# Patient Record
Sex: Male | Born: 1970 | Race: White | Hispanic: No | Marital: Single | Smoking: Current some day smoker
Health system: Southern US, Community
[De-identification: ages and names within clinical notes are randomized; demographics above are authoritative.]

## PROBLEM LIST (undated history)

## (undated) DIAGNOSIS — M4800 Spinal stenosis, site unspecified: Secondary | ICD-10-CM

## (undated) HISTORY — PX: TONSILLECTOMY: SUR1361

---

## 2004-11-06 ENCOUNTER — Other Ambulatory Visit: Payer: Self-pay

## 2004-11-06 ENCOUNTER — Emergency Department: Payer: Self-pay | Admitting: Emergency Medicine

## 2013-03-10 ENCOUNTER — Emergency Department: Payer: Self-pay | Admitting: Emergency Medicine

## 2013-03-10 LAB — BASIC METABOLIC PANEL
Anion Gap: 4 — ABNORMAL LOW (ref 7–16)
BUN: 7 mg/dL (ref 7–18)
Calcium, Total: 8.8 mg/dL (ref 8.5–10.1)
Co2: 28 mmol/L (ref 21–32)
Creatinine: 1.02 mg/dL (ref 0.60–1.30)
Potassium: 3.8 mmol/L (ref 3.5–5.1)
Sodium: 136 mmol/L (ref 136–145)

## 2013-03-10 LAB — CBC
MCH: 31.8 pg (ref 26.0–34.0)
Platelet: 358 10*3/uL (ref 150–440)
RBC: 4.89 10*6/uL (ref 4.40–5.90)
WBC: 9.9 10*3/uL (ref 3.8–10.6)

## 2013-03-10 LAB — TROPONIN I: Troponin-I: <0.02 ng/mL

## 2014-02-11 ENCOUNTER — Emergency Department: Payer: Self-pay | Admitting: Emergency Medicine

## 2014-12-10 ENCOUNTER — Emergency Department: Payer: Managed Care, Other (non HMO)

## 2014-12-10 ENCOUNTER — Encounter: Payer: Self-pay | Admitting: Emergency Medicine

## 2014-12-10 ENCOUNTER — Emergency Department
Admission: EM | Admit: 2014-12-10 | Discharge: 2014-12-10 | Disposition: A | Discharge status: Home / Self Care | Payer: Managed Care, Other (non HMO) | Attending: Emergency Medicine | Admitting: Emergency Medicine

## 2014-12-10 DIAGNOSIS — S299XXA Unspecified injury of thorax, initial encounter: Secondary | ICD-10-CM | POA: Diagnosis present

## 2014-12-10 DIAGNOSIS — Z720 Tobacco use: Secondary | ICD-10-CM | POA: Insufficient documentation

## 2014-12-10 DIAGNOSIS — Y9289 Other specified places as the place of occurrence of the external cause: Secondary | ICD-10-CM | POA: Insufficient documentation

## 2014-12-10 DIAGNOSIS — W271XXA Contact with garden tool, initial encounter: Secondary | ICD-10-CM | POA: Diagnosis not present

## 2014-12-10 DIAGNOSIS — S4992XA Unspecified injury of left shoulder and upper arm, initial encounter: Secondary | ICD-10-CM | POA: Diagnosis not present

## 2014-12-10 DIAGNOSIS — Y998 Other external cause status: Secondary | ICD-10-CM | POA: Diagnosis not present

## 2014-12-10 DIAGNOSIS — S4991XA Unspecified injury of right shoulder and upper arm, initial encounter: Secondary | ICD-10-CM | POA: Diagnosis not present

## 2014-12-10 DIAGNOSIS — Y9389 Activity, other specified: Secondary | ICD-10-CM | POA: Diagnosis not present

## 2014-12-10 DIAGNOSIS — S161XXA Strain of muscle, fascia and tendon at neck level, initial encounter: Secondary | ICD-10-CM | POA: Insufficient documentation

## 2014-12-10 MED ORDER — NAPROXEN 500 MG PO TABS: 500.0000 mg | ORAL_TABLET | Freq: Two times a day (BID) | ORAL | Status: AC

## 2014-12-10 MED ORDER — CYCLOBENZAPRINE HCL 10 MG PO TABS: 10.0000 mg | ORAL_TABLET | Freq: Three times a day (TID) | ORAL | Status: AC | PRN

## 2014-12-10 MED ORDER — CYCLOBENZAPRINE HCL 10 MG PO TABS
10.0000 mg | ORAL_TABLET | Freq: Once | ORAL | Status: AC
  Administered 2014-12-10: 10 mg via ORAL
  Filled 2014-12-10: qty 1

## 2014-12-10 MED ORDER — NAPROXEN 500 MG PO TABS
500.0000 mg | ORAL_TABLET | Freq: Once | ORAL | Status: AC
  Administered 2014-12-10: 500 mg via ORAL
  Filled 2014-12-10: qty 1

## 2014-12-10 NOTE — ED Provider Notes (Signed)
[facility] Emergency Department Provider Note  ____________________________________________  Time seen: Approximately 1:17 PM  I have reviewed the triage vital signs and the nursing notes.   HISTORY  Chief Complaint Back Pain   HPI GREGARY BLACKARD is a 44 y.o. male resents to the emergency department for evaluation of back pain. States yesterday while mowing the lawn he slid when he was trying to pull the mower up a hill. He states the mower came over top of him and hit him in the back. He woke up this morning with pain in the upper back and shoulders. History reviewed. No pertinent past medical history.  There are no active problems to display for this patient.   Past Surgical History  Procedure Laterality Date  . Tonsillectomy      Current Outpatient Rx  Name  Route  Sig  Dispense  Refill  . cyclobenzaprine (FLEXERIL) 10 MG tablet   Oral   Take 1 tablet (10 mg total) by mouth 3 (three) times daily as needed for muscle spasms.   30 tablet   0   . naproxen (NAPROSYN) 500 MG tablet   Oral   Take 1 tablet (500 mg total) by mouth 2 (two) times daily with a meal.   60 tablet   2     Allergies Review of patient's allergies indicates no known allergies.  No family history on file.  Social History Social History  Substance Use Topics  . Smoking status: Current Some Day Smoker    Types: Cigarettes  . Smokeless tobacco: None  . Alcohol Use: No    Review of Systems Constitutional: No recent illness. Eyes: No visual changes. ENT: No sore throat. Cardiovascular: Denies chest pain or palpitations. Respiratory: Denies shortness of breath. Gastrointestinal: No abdominal pain.  Genitourinary: Negative for dysuria. Musculoskeletal: Pain in upper back and bilateral shoulders Skin: Negative for rash. Neurological: Negative for headaches, focal weakness or numbness. 10-point ROS otherwise  negative.  ____________________________________________   PHYSICAL EXAM:  VITAL SIGNS: ED Triage Vitals  Enc Vitals Group     BP 12/10/14 1301 144/96 mmHg     Pulse Rate 12/10/14 1301 67     Resp 12/10/14 1301 18     Temp 12/10/14 1301 98.3 F (36.8 C)     Temp Source 12/10/14 1301 Oral     SpO2 12/10/14 1301 99 %     Weight 12/10/14 1259 185 lb (83.915 kg)     Height 12/10/14 1259 5\' 9"  (1.753 m)     Head Cir --      Peak Flow --      Pain Score 12/10/14 1300 7     Pain Loc --      Pain Edu? --      Excl. in GC? --     Constitutional: Alert and oriented. Well appearing and in no acute distress. Eyes: Conjunctivae are normal. EOMI. Head: Atraumatic. Nose: No congestion/rhinnorhea. Neck: No stridor.  Respiratory: Normal respiratory effort.   Musculoskeletal: Paraspinal tenderness of C-spine bilaterally that radiates down into the scapular region. No midline tenderness-nexus criteria negative. Neurologic:  Normal speech and language. No gross focal neurologic deficits are appreciated. Speech is normal. No gait instability. Skin:  Skin is warm, dry and intact. Atraumatic. Psychiatric: Mood and affect are normal. Speech and behavior are normal.  ____________________________________________   LABS (all labs ordered are listed, but only abnormal results are displayed)  Labs Reviewed - No data to display ____________________________________________  RADIOLOGY  C-spine  film negative for acute bony abnormality.  I, Kem Boroughs, personally viewed and evaluated these images (plain radiographs) as part of my medical decision making.  ____________________________________________   PROCEDURES  Procedure(s) performed: None   ____________________________________________   INITIAL IMPRESSION / ASSESSMENT AND PLAN / ED COURSE  Pertinent labs & imaging results that were available during my care of the patient were reviewed by me and considered in my medical decision  making (see chart for details).  Patient was advised to follow-up with his primary care provider or return to emergency department for symptoms that are not improving over the week. He is advised to return to emergency sooner for symptoms that change or worsen. ____________________________________________   FINAL CLINICAL IMPRESSION(S) / ED DIAGNOSES  Final diagnoses:  Cervical strain, acute, initial encounter       Chinita Pester, FNP 12/10/14 1613  Governor Rooks, MD 12/12/14 [phone]

## 2014-12-10 NOTE — ED Notes (Signed)
Pt states he was mowing the lawn, slid while trying to mow lawn up hill, woke up this am with pain in upper back and shoulders.

## 2014-12-10 NOTE — Discharge Instructions (Signed)

## 2015-11-30 ENCOUNTER — Emergency Department
Admission: EM | Admit: 2015-11-30 | Discharge: 2015-11-30 | Disposition: A | Discharge status: Home / Self Care | Payer: Managed Care, Other (non HMO) | Attending: Emergency Medicine | Admitting: Emergency Medicine

## 2015-11-30 ENCOUNTER — Emergency Department: Payer: Managed Care, Other (non HMO)

## 2015-11-30 ENCOUNTER — Encounter: Payer: Self-pay | Admitting: Emergency Medicine

## 2015-11-30 DIAGNOSIS — F17210 Nicotine dependence, cigarettes, uncomplicated: Secondary | ICD-10-CM | POA: Diagnosis not present

## 2015-11-30 DIAGNOSIS — R079 Chest pain, unspecified: Secondary | ICD-10-CM | POA: Insufficient documentation

## 2015-11-30 DIAGNOSIS — M5412 Radiculopathy, cervical region: Secondary | ICD-10-CM | POA: Insufficient documentation

## 2015-11-30 DIAGNOSIS — M25512 Pain in left shoulder: Secondary | ICD-10-CM | POA: Insufficient documentation

## 2015-11-30 DIAGNOSIS — M5410 Radiculopathy, site unspecified: Secondary | ICD-10-CM

## 2015-11-30 HISTORY — DX: Spinal stenosis, site unspecified: M48.00

## 2015-11-30 LAB — BASIC METABOLIC PANEL
ANION GAP: 10 (ref 5–15)
BUN: 16 mg/dL (ref 6–20)
CHLORIDE: 101 mmol/L (ref 101–111)
CO2: 27 mmol/L (ref 22–32)
Calcium: 9.2 mg/dL (ref 8.9–10.3)
Creatinine, Ser: 0.98 mg/dL (ref 0.61–1.24)
Glucose, Bld: 102 mg/dL — ABNORMAL HIGH (ref 65–99)
POTASSIUM: 3.4 mmol/L — AB (ref 3.5–5.1)
SODIUM: 138 mmol/L (ref 135–145)

## 2015-11-30 LAB — CBC
HEMATOCRIT: 46.8 % (ref 40.0–52.0)
HEMOGLOBIN: 16.3 g/dL (ref 13.0–18.0)
MCH: 32.4 pg (ref 26.0–34.0)
MCHC: 34.8 g/dL (ref 32.0–36.0)
MCV: 93.3 fL (ref 80.0–100.0)
Platelets: 385 10*3/uL (ref 150–440)
RBC: 5.01 MIL/uL (ref 4.40–5.90)
RDW: 13.9 % (ref 11.5–14.5)
WBC: 13.4 10*3/uL — AB (ref 3.8–10.6)

## 2015-11-30 LAB — TROPONIN I: Troponin I: <0.03 ng/mL (ref <0.03)

## 2015-11-30 MED ORDER — LIDOCAINE 5 % EX PTCH
1.0000 | MEDICATED_PATCH | CUTANEOUS | Status: DC
  Administered 2015-11-30: 1 via TRANSDERMAL
  Filled 2015-11-30: qty 1

## 2015-11-30 MED ORDER — MORPHINE SULFATE (PF) 4 MG/ML IV SOLN: 4.0000 mg | Freq: Once | INTRAVENOUS | Status: DC

## 2015-11-30 MED ORDER — OXYCODONE-ACETAMINOPHEN 5-325 MG PO TABS
ORAL_TABLET | ORAL | Status: AC
  Administered 2015-11-30: 1 via ORAL
  Filled 2015-11-30: qty 1

## 2015-11-30 MED ORDER — DIAZEPAM 2 MG PO TABS: 2.0000 mg | ORAL_TABLET | Freq: Three times a day (TID) | ORAL | 0 refills | Status: AC | PRN

## 2015-11-30 MED ORDER — OXYCODONE-ACETAMINOPHEN 5-325 MG PO TABS
1.0000 | ORAL_TABLET | Freq: Once | ORAL | Status: AC
  Administered 2015-11-30: 1 via ORAL

## 2015-11-30 MED ORDER — DIAZEPAM 2 MG PO TABS
2.0000 mg | ORAL_TABLET | Freq: Once | ORAL | Status: AC
  Administered 2015-11-30: 2 mg via ORAL
  Filled 2015-11-30: qty 1

## 2015-11-30 MED ORDER — KETOROLAC TROMETHAMINE 60 MG/2ML IM SOLN
60.0000 mg | Freq: Once | INTRAMUSCULAR | Status: AC
  Administered 2015-11-30: 60 mg via INTRAMUSCULAR
  Filled 2015-11-30: qty 2

## 2015-11-30 MED ORDER — LIDOCAINE 5 % EX PTCH
1.0000 | MEDICATED_PATCH | Freq: Two times a day (BID) | CUTANEOUS | 0 refills | Status: AC
Start: 2015-11-30 — End: 2016-11-29

## 2015-11-30 MED ORDER — TRAMADOL HCL 50 MG PO TABS: 50.0000 mg | ORAL_TABLET | Freq: Four times a day (QID) | ORAL | 0 refills | Status: AC | PRN

## 2015-11-30 NOTE — ED Triage Notes (Signed)
Patient ambulatory to triage with steady gait, without difficulty or distress noted; c/o pain left side chest radiating down arm; st hx spinal stenosis; seen by PCP 8/9 for left arm pain and dx with "thyroid issues" with recent weight loss; denies any accomp symptoms; denies hx of same

## 2015-11-30 NOTE — ED Provider Notes (Signed)
[facility] Emergency Department Provider Note   ____________________________________________   First MD Initiated Contact with Patient 11/30/15 [phone]     (approximate)  I have reviewed the triage vital signs and the nursing notes.   HISTORY  Chief Complaint Chest Pain    HPI Anthony Dennis is a 45 y.o. male who comes into the hospital today with some chest and shoulder pain. The patient reports that the pain started about a week ago. He has a history of spinal stenosis he reports in his neck. He reports a week ago he started having some shooting pains from his neck down into his shoulder and his arm. When he went to the doctor they realize he lost 40 pounds in the past 6 months. He was tested and he reports that they found something wrong with his thyroid that he's not exactly sure what. He was given some prednisone as well as some gabapentin. He reports that he is still had some pain. The patient reports that the pain is started to move down his arm and into his left chest. He reports that his left arm feels numb to the touch. He reports that his pain is also in his left neck and it sore to the touch. The patient rates pain a 6-7 out of 10 in intensity. He reports this bearable currently but he keeps having these sharp pains that are like lightning going down his arm. He reports it is been going on since about 9 PM and has not stopped. He reports it is better when he slammed back but feels worse when he sitting up and moving. The patient denies any pain is worse with deep inspiration and he reports that it does hurt when he moves. He is here for evaluation.   Past Medical History:  Diagnosis Date  . Spinal stenosis     There are no active problems to display for this patient.   Past Surgical History:  Procedure Laterality Date  . TONSILLECTOMY      Prior to Admission medications   Medication Sig Start Date End Date Taking? Authorizing Provider    cyclobenzaprine (FLEXERIL) 10 MG tablet Take 1 tablet (10 mg total) by mouth 3 (three) times daily as needed for muscle spasms. Patient not taking: Reported on 11/30/2015 12/10/14   Chinita Pesterari B Triplett, FNP  diazepam (VALIUM) 2 MG tablet Take 1 tablet (2 mg total) by mouth every 8 (eight) hours as needed for anxiety. 11/30/15 11/29/16  Rebecka ApleyAllison P Deanette Tullius, MD  lidocaine (LIDODERM) 5 % Place 1 patch onto the skin every 12 (twelve) hours. Remove & Discard patch within 12 hours or as directed by MD 11/30/15 11/29/16  Rebecka ApleyAllison P Duyen Beckom, MD  naproxen (NAPROSYN) 500 MG tablet Take 1 tablet (500 mg total) by mouth 2 (two) times daily with a meal. Patient not taking: Reported on 11/30/2015 12/10/14 12/10/15  Chinita Pesterari B Triplett, FNP  traMADol (ULTRAM) 50 MG tablet Take 1 tablet (50 mg total) by mouth every 6 (six) hours as needed. 11/30/15   Rebecka ApleyAllison P Aubre Quincy, MD    Allergies Review of patient's allergies indicates no known allergies.  No family history on file.  Social History Social History  Substance Use Topics  . Smoking status: Current Some Day Smoker    Types: Cigarettes  . Smokeless tobacco: Never Used  . Alcohol use No    Review of Systems Constitutional: No fever/chills Eyes: No visual changes. ENT: No sore throat. Cardiovascular: chest pain. Respiratory: Denies shortness  of breath. Gastrointestinal: No abdominal pain.  No nausea, no vomiting.  No diarrhea.  No constipation. Genitourinary: Negative for dysuria. Musculoskeletal: Neck pain/arm pain Skin: Negative for rash. Neurological: Negative for headaches, focal weakness or numbness.  10-point ROS otherwise negative.  ____________________________________________   PHYSICAL EXAM:  VITAL SIGNS: ED Triage Vitals  Enc Vitals Group     BP 11/30/15 0108 (!) 128/91     Pulse Rate 11/30/15 0108 84     Resp 11/30/15 0108 20     Temp 11/30/15 0108 97.9 F (36.6 C)     Temp src --      SpO2 11/30/15 0108 98 %     Weight 11/30/15 0104 155 lb  (70.3 kg)     Height 11/30/15 0104 5\' 9"  (1.753 m)     Head Circumference --      Peak Flow --      Pain Score 11/30/15 0104 7     Pain Loc --      Pain Edu? --      Excl. in GC? --     Constitutional: Alert and oriented. Well appearing and in moderate distress. Eyes: Conjunctivae are normal. PERRL. EOMI. Head: Atraumatic. Nose: No congestion/rhinnorhea. Mouth/Throat: Mucous membranes are moist.  Oropharynx non-erythematous. Neck: No cervical spine tenderness to palpation. Cardiovascular: Normal rate, regular rhythm. Grossly normal heart sounds.  Good peripheral circulation. Respiratory: Normal respiratory effort.  No retractions. Lungs CTAB. Gastrointestinal: Soft and nontender. No distention. Positive bowel sounds Musculoskeletal: No lower extremity tenderness nor edema.   Neurologic:  Normal speech and language.  Skin:  Skin is warm, dry and intact. Marland Kitchen Psychiatric: Mood and affect are normal.   ____________________________________________   LABS (all labs ordered are listed, but only abnormal results are displayed)  Labs Reviewed  BASIC METABOLIC PANEL - Abnormal; Notable for the following:       Result Value   Potassium 3.4 (*)    Glucose, Bld 102 (*)    All other components within normal limits  CBC - Abnormal; Notable for the following:    WBC 13.4 (*)    All other components within normal limits  TROPONIN I  TROPONIN I   ____________________________________________  EKG  ED ECG REPORT I, Rebecka Apley, the attending physician, personally viewed and interpreted this ECG.   Date: 11/30/2015  EKG Time: 108  Rate: 86  Rhythm: normal sinus rhythm  Axis: normal  Intervals:none  ST&T Change: none  ____________________________________________  RADIOLOGY  CXR: No active cardiopulmonary disease  DG cervical spine: Normal alignment of the cervical spine. Mild degenerative changes. No acute displaced fractures  identified ____________________________________________   PROCEDURES  Procedure(s) performed: None  Procedures  Critical Care performed: No  ____________________________________________   INITIAL IMPRESSION / ASSESSMENT AND PLAN / ED COURSE  Pertinent labs & imaging results that were available during my care of the patient were reviewed by me and considered in my medical decision making (see chart for details).  This is a 45 year old male who comes in with some pain in his left neck, shoulder and in his left chest. The patient reports that he's concerned about his heart which is what brought him into the hospital today. We will check some blood work as well as some imaging and I will attempt to give the patient some medication with Toradol, Valium and a Lidoderm patch.  Clinical Course  Value Comment By Time  DG Cervical Spine 2-3 Views Normal alignment of the cervical spine. Mild degenerative changes.  No acute displaced fractures identified Rebecka ApleyAllison P Cherae Marton, MD 08/15 [phone]  DG Chest 2 View No active cardiopulmonary disease Rebecka ApleyAllison P Hyla Coard, MD 08/15 [phone]    The patient reports that he still has some pain but it has improved from his initial arrival. I informed the patient that I think his pain is due to radiculopathy given the shooting lightning sensations that he is having going in his arm and shoulder. The patient reports that [place] [place] and was told that his spinal canal stenosis was so severe that he may need surgery. He reports that he does have an MRI scheduled for September 4. I informed him to contact his orthopedic surgeon and have them evaluate him for possible injections to help with his discomfort. The patient's cardiac enzymes are unremarkable. His chest x-ray is also negative. He will be discharged home to follow-up with his orthopedic physician. The patient also did receive a dose of Percocet and a shot of morphine  for his pain. ____________________________________________   FINAL CLINICAL IMPRESSION(S) / ED DIAGNOSES  Final diagnoses:  Radicular pain  Left shoulder pain  Cervical radicular pain  Chest pain, unspecified chest pain type      NEW MEDICATIONS STARTED DURING THIS VISIT:  New Prescriptions   DIAZEPAM (VALIUM) 2 MG TABLET    Take 1 tablet (2 mg total) by mouth every 8 (eight) hours as needed for anxiety.   LIDOCAINE (LIDODERM) 5 %    Place 1 patch onto the skin every 12 (twelve) hours. Remove & Discard patch within 12 hours or as directed by MD   TRAMADOL (ULTRAM) 50 MG TABLET    Take 1 tablet (50 mg total) by mouth every 6 (six) hours as needed.     Note:  This document was prepared using Dragon voice recognition software and may include unintentional dictation errors.    Rebecka ApleyAllison P Jassiah Viviano, MD 11/30/15 0800

## 2016-05-03 ENCOUNTER — Emergency Department
Admission: EM | Admit: 2016-05-03 | Discharge: 2016-05-03 | Disposition: A | Discharge status: Home / Self Care | Payer: Managed Care, Other (non HMO) | Attending: Emergency Medicine | Admitting: Emergency Medicine

## 2016-05-03 ENCOUNTER — Encounter: Payer: Self-pay | Admitting: Emergency Medicine

## 2016-05-03 ENCOUNTER — Emergency Department: Payer: Managed Care, Other (non HMO)

## 2016-05-03 DIAGNOSIS — W01198A Fall on same level from slipping, tripping and stumbling with subsequent striking against other object, initial encounter: Secondary | ICD-10-CM | POA: Diagnosis not present

## 2016-05-03 DIAGNOSIS — S0990XA Unspecified injury of head, initial encounter: Secondary | ICD-10-CM | POA: Diagnosis not present

## 2016-05-03 DIAGNOSIS — F17210 Nicotine dependence, cigarettes, uncomplicated: Secondary | ICD-10-CM | POA: Diagnosis not present

## 2016-05-03 DIAGNOSIS — Y939 Activity, unspecified: Secondary | ICD-10-CM | POA: Diagnosis not present

## 2016-05-03 DIAGNOSIS — S82831A Other fracture of upper and lower end of right fibula, initial encounter for closed fracture: Secondary | ICD-10-CM | POA: Diagnosis not present

## 2016-05-03 DIAGNOSIS — Y92009 Unspecified place in unspecified non-institutional (private) residence as the place of occurrence of the external cause: Secondary | ICD-10-CM | POA: Diagnosis not present

## 2016-05-03 DIAGNOSIS — S99911A Unspecified injury of right ankle, initial encounter: Secondary | ICD-10-CM | POA: Diagnosis present

## 2016-05-03 DIAGNOSIS — W19XXXA Unspecified fall, initial encounter: Secondary | ICD-10-CM

## 2016-05-03 DIAGNOSIS — Y999 Unspecified external cause status: Secondary | ICD-10-CM | POA: Diagnosis not present

## 2016-05-03 LAB — BASIC METABOLIC PANEL
Anion gap: 6 (ref 5–15)
BUN: 8 mg/dL (ref 6–20)
CALCIUM: 8.7 mg/dL — AB (ref 8.9–10.3)
CO2: 26 mmol/L (ref 22–32)
CREATININE: 0.92 mg/dL (ref 0.61–1.24)
Chloride: 106 mmol/L (ref 101–111)
GFR calc non Af Amer: >60 mL/min (ref >60)
GLUCOSE: 103 mg/dL — AB (ref 65–99)
Potassium: 3.6 mmol/L (ref 3.5–5.1)
Sodium: 138 mmol/L (ref 135–145)

## 2016-05-03 LAB — CBC WITH DIFFERENTIAL/PLATELET
BASOS PCT: 3 %
Basophils Absolute: 0.3 10*3/uL — ABNORMAL HIGH (ref 0–0.1)
EOS ABS: 0.2 10*3/uL (ref 0–0.7)
EOS PCT: 2 %
HCT: 43.3 % (ref 40.0–52.0)
Hemoglobin: 15 g/dL (ref 13.0–18.0)
Lymphocytes Relative: 18 %
Lymphs Abs: 1.6 10*3/uL (ref 1.0–3.6)
MCH: 32.2 pg (ref 26.0–34.0)
MCHC: 34.6 g/dL (ref 32.0–36.0)
MCV: 92.9 fL (ref 80.0–100.0)
MONO ABS: 0.7 10*3/uL (ref 0.2–1.0)
Monocytes Relative: 7 %
NEUTROS PCT: 70 %
Neutro Abs: 6.5 10*3/uL (ref 1.4–6.5)
Platelets: 425 10*3/uL (ref 150–440)
RBC: 4.66 MIL/uL (ref 4.40–5.90)
RDW: 13.6 % (ref 11.5–14.5)
WBC: 9.2 10*3/uL (ref 3.8–10.6)

## 2016-05-03 MED ORDER — OXYCODONE-ACETAMINOPHEN 5-325 MG PO TABS
2.0000 | ORAL_TABLET | Freq: Once | ORAL | Status: AC
  Administered 2016-05-03: 2 via ORAL
  Filled 2016-05-03: qty 2

## 2016-05-03 MED ORDER — HYDROCODONE-ACETAMINOPHEN 5-325 MG PO TABS: 1.0000 | ORAL_TABLET | Freq: Four times a day (QID) | ORAL | 0 refills | Status: AC | PRN
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
## 2016-05-03 NOTE — ED Triage Notes (Signed)
Pt states he slipped on the ice and fell injuring his right knee.

## 2016-05-03 NOTE — ED Notes (Addendum)
Patient transported to CT 

## 2018-07-24 IMAGING — CR DG CHEST 2V
1 series · 2 of 2 positions shown · non-contrast
Comparison: Left ribs 11/06/2004

CLINICAL DATA: Chest pain for 2 days. Left-sided chest pain
extending down the arm and fingers as well as neck and jaw.

EXAM:
CHEST  2 VIEW

[Series 1: dg chest 2 view · 0.14mm/px · 2 of 2 slices shown]
[im 1/2]
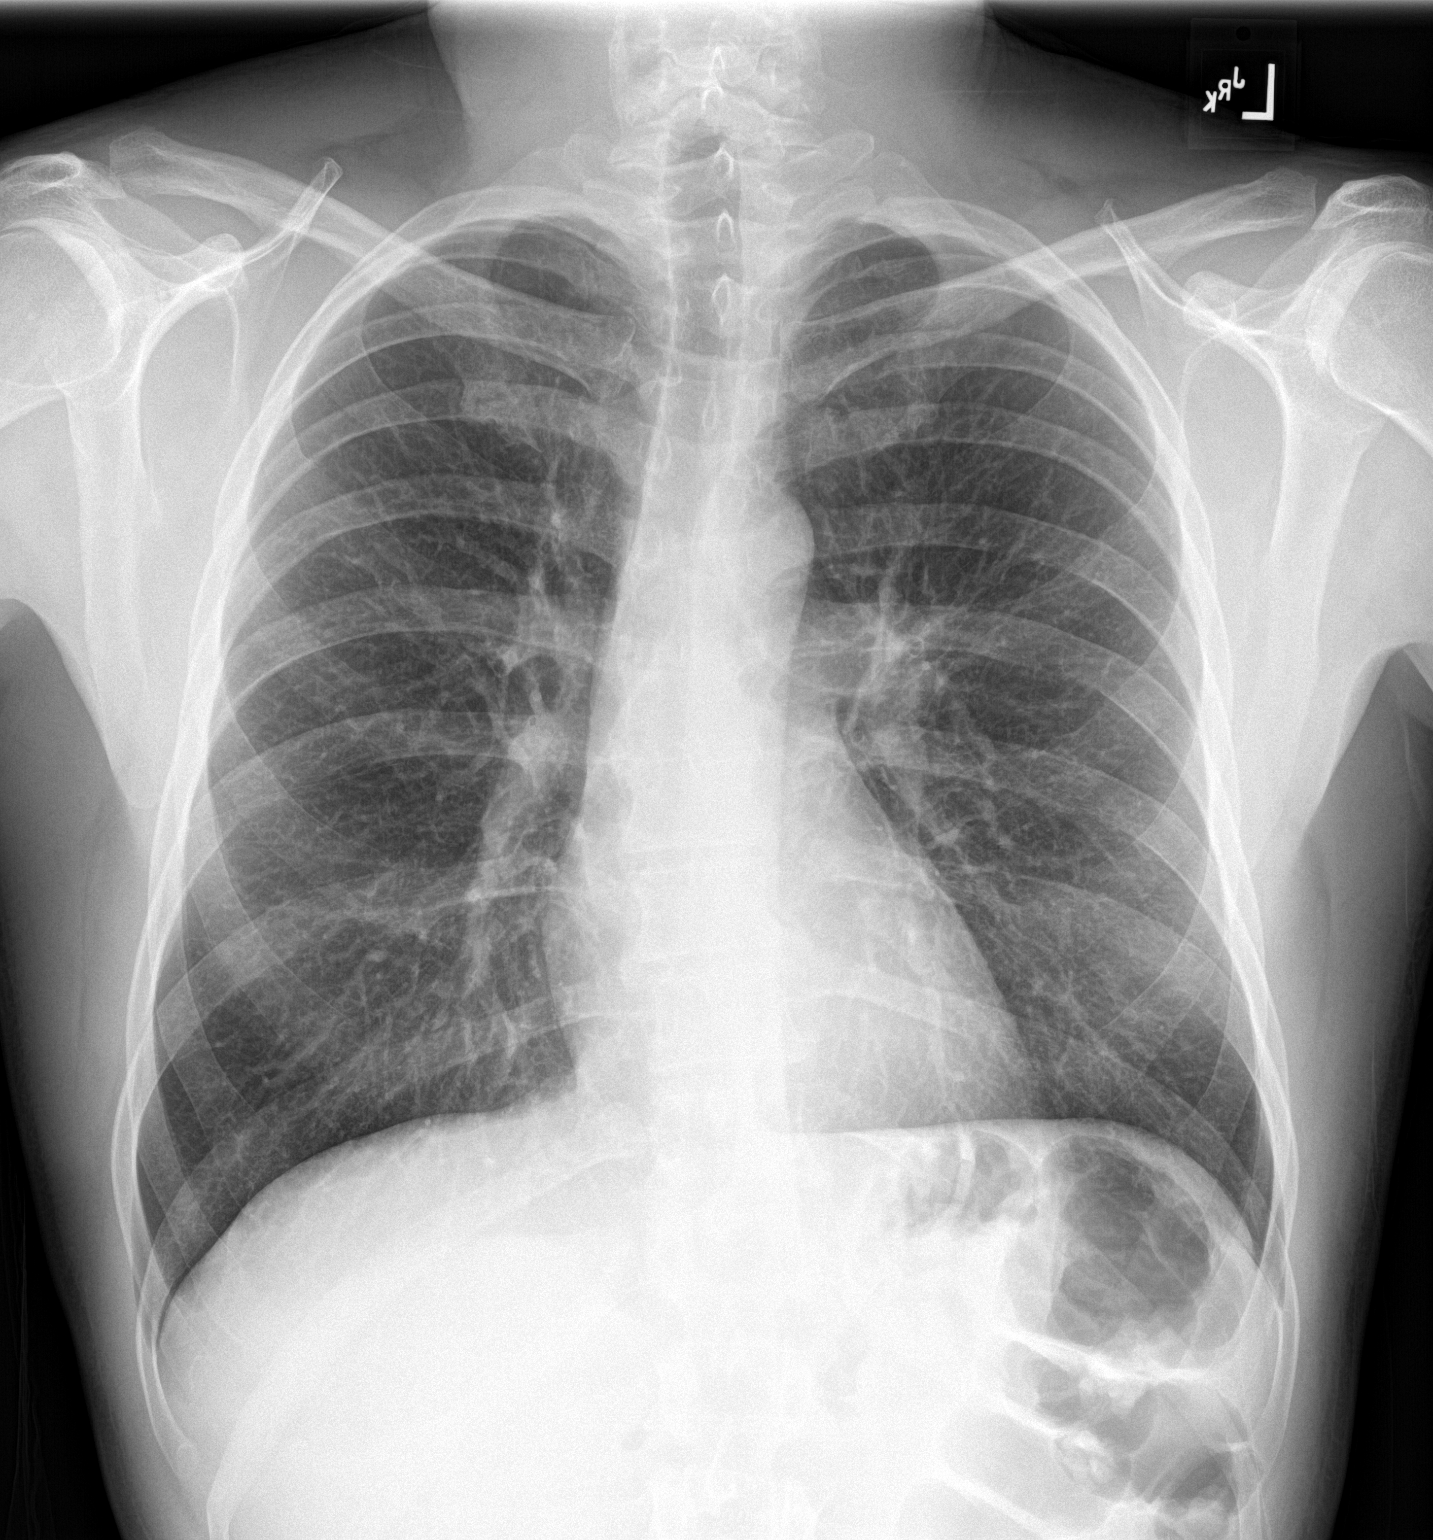
[im 2/2]
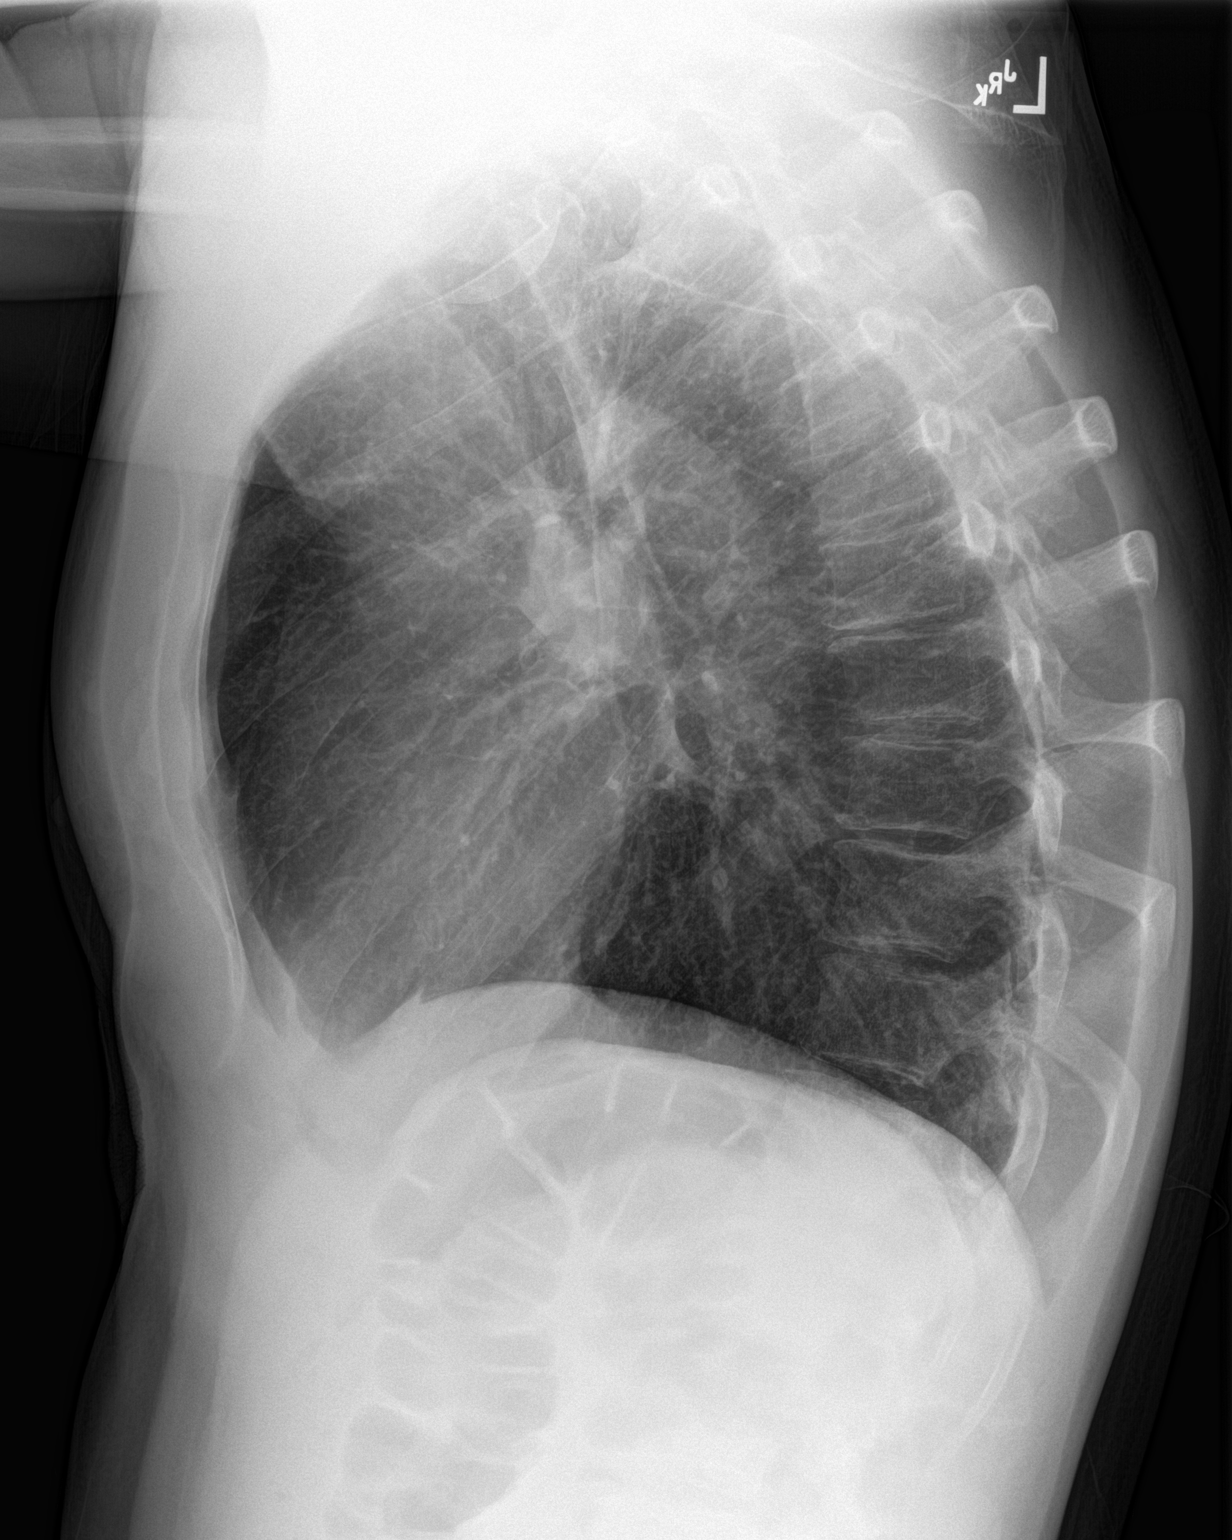

[2 of 2 positions shown; findings below may reference images not displayed]

FINDINGS: Mild hyperinflation. The heart size and mediastinal contours are
within normal limits. Both lungs are clear. The visualized skeletal
structures are unremarkable.
IMPRESSION: No active cardiopulmonary disease.

## 2018-12-26 IMAGING — CT CT HEAD W/O CM
3 series · 16 of 47 positions shown, 19 images · non-contrast
Comparison: None.

CLINICAL DATA: Fall on the ice with head injury and loss of
consciousness.

EXAM:
CT HEAD WITHOUT CONTRAST
TECHNIQUE: Contiguous axial images were obtained from the base of the skull
through the vertex without intravenous contrast.

[Series 2: head wo · axial · 0.43mm/px · z∈[+474,+599]mm · 10 of 30 slices shown, 13 images]
[im 3/30  brain]
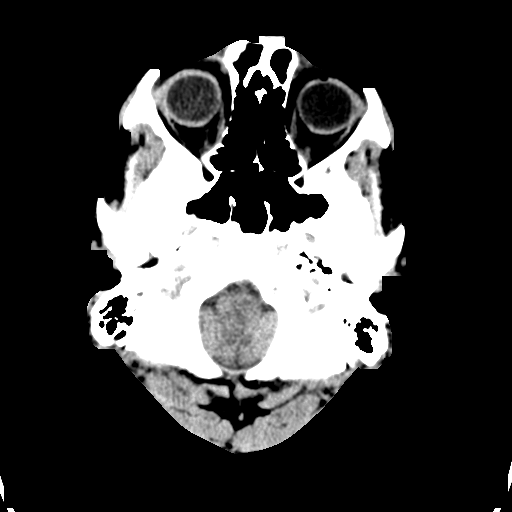
[im 3/30  bone]
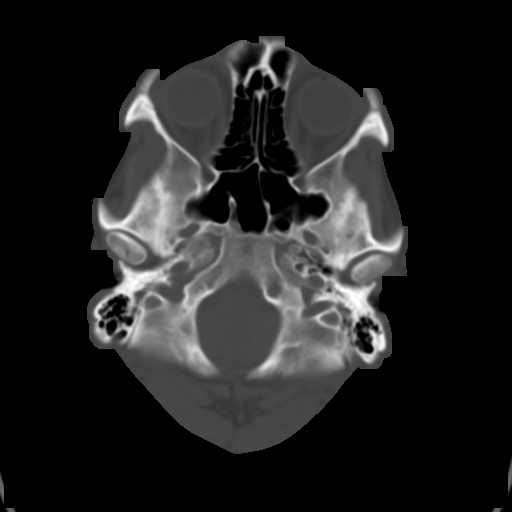
[im 6/30  brain]
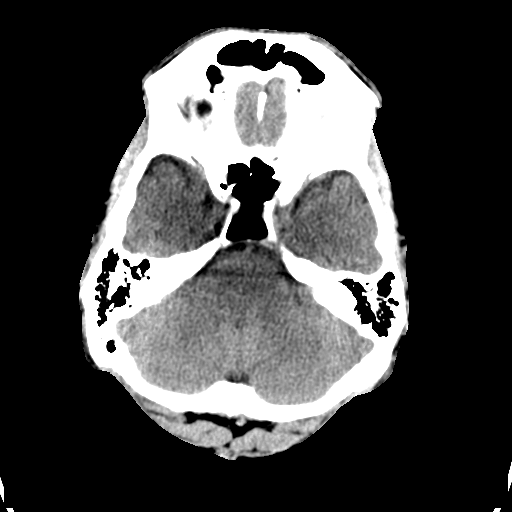
[im 9/30  brain]
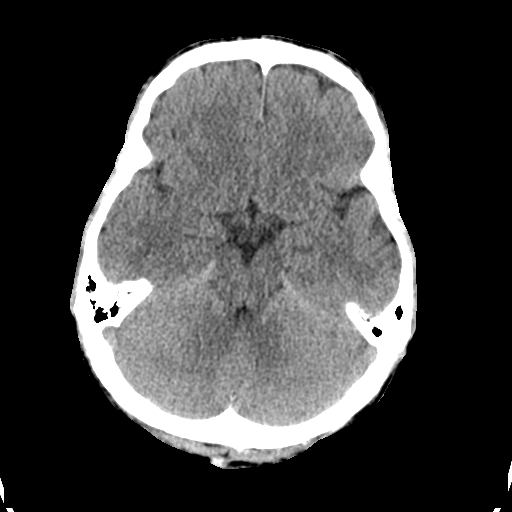
[im 11/30  brain]
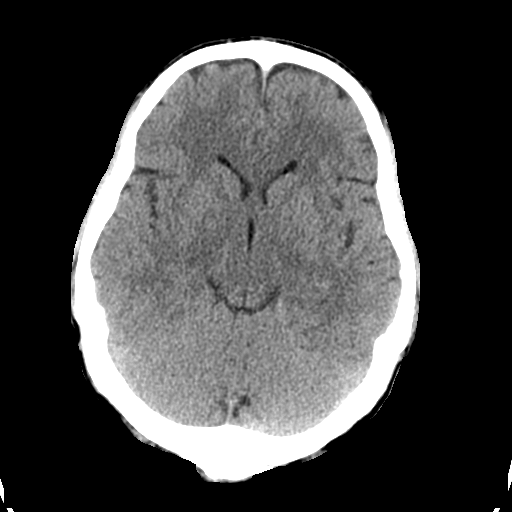
[im 14/30  brain]
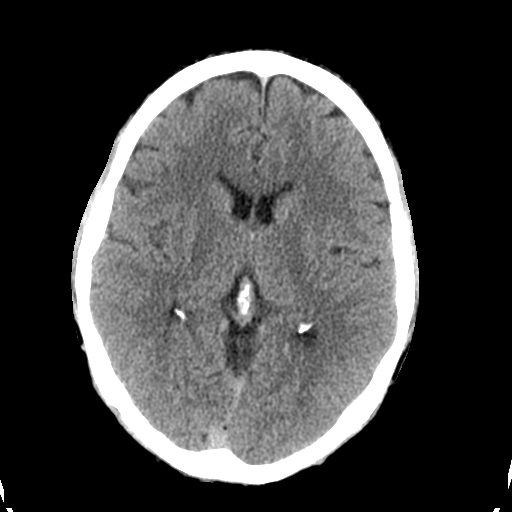
[im 14/30  bone]
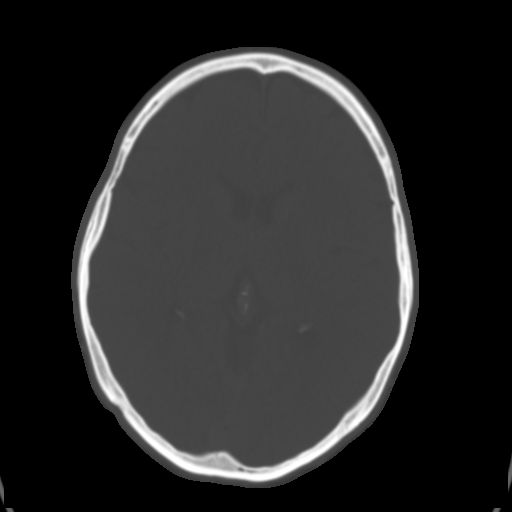
[im 17/30  brain]
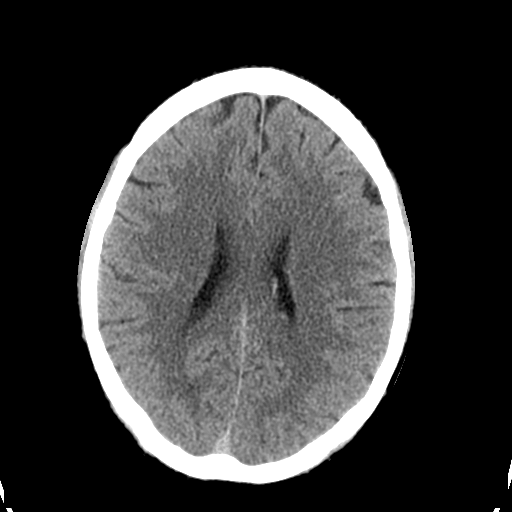
[im 20/30  brain]
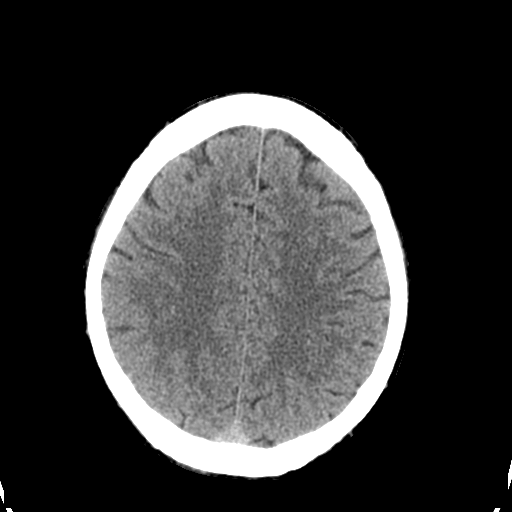
[im 23/30  brain]
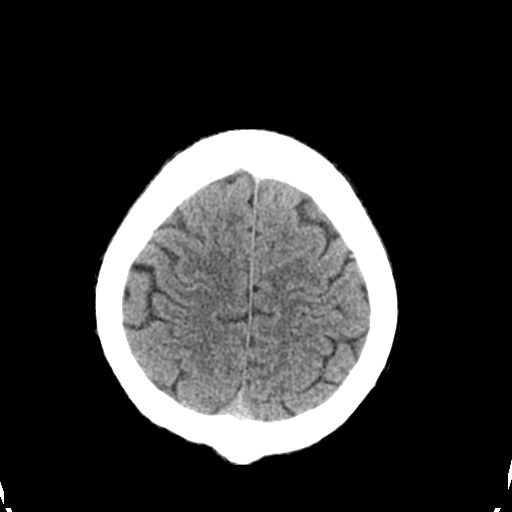
[im 25/30  brain]
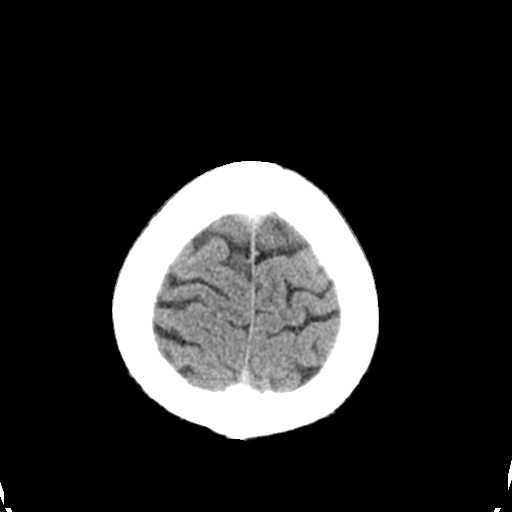
[im 25/30  bone]
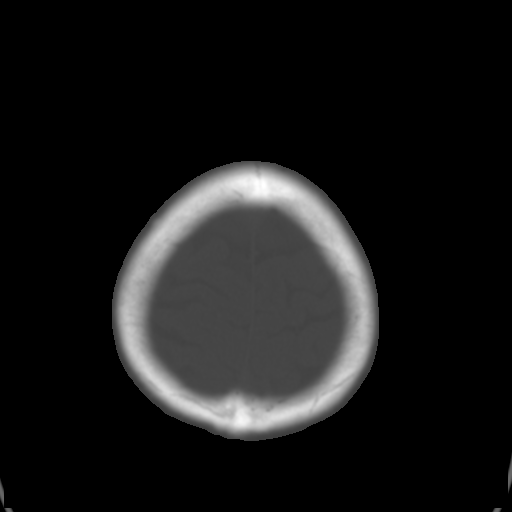
[im 28/30  brain]
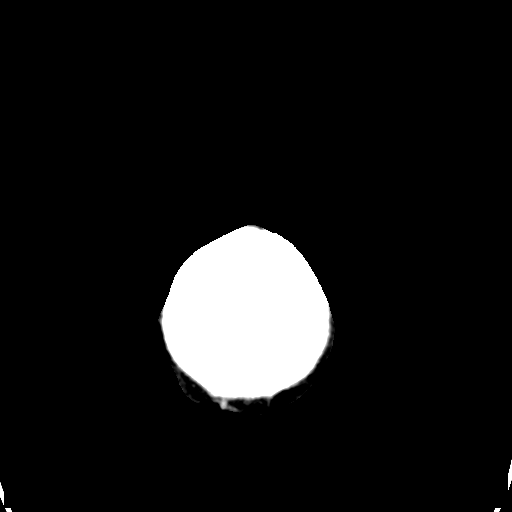

[Series 4: coronal soft tissue · coronal · 0.32mm/px · 3 of 70 slices shown]
[im 24/70  brain]
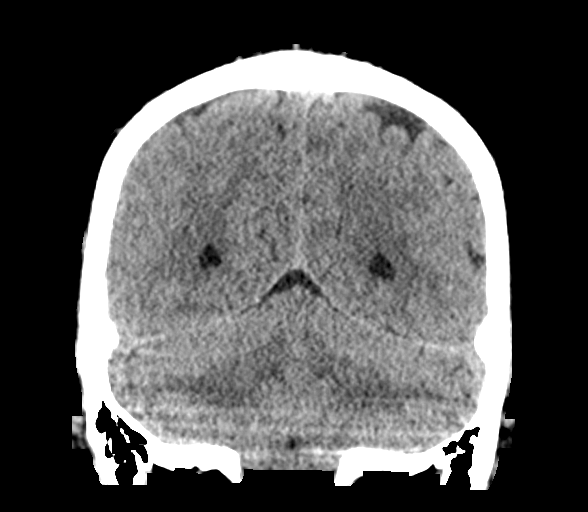
[im 31/70  brain]
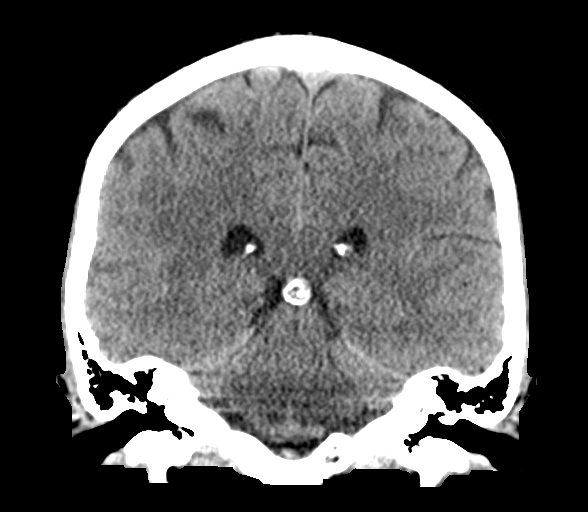
[im 39/70  brain]
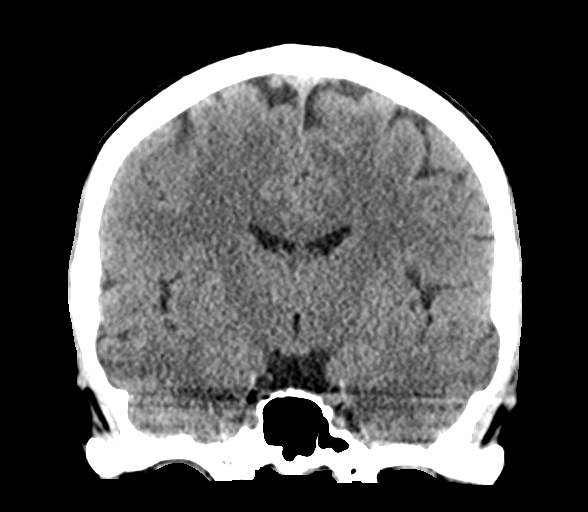

[Series 5: sagittal soft tissue · sagittal · 0.34mm/px · 3 of 60 slices shown]
[im 20/60  brain]
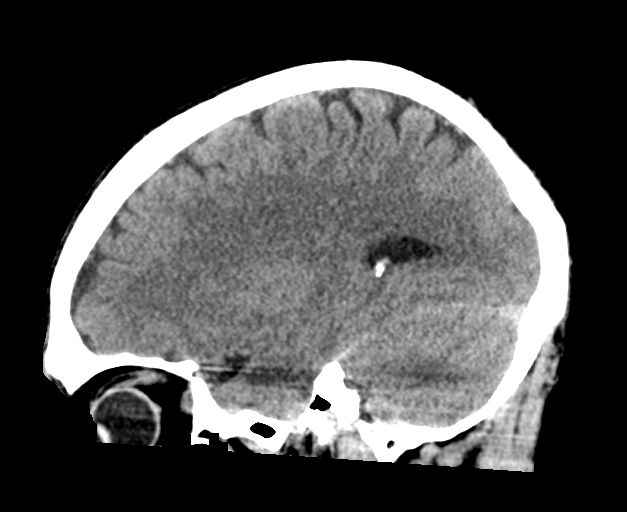
[im 30/60  brain]
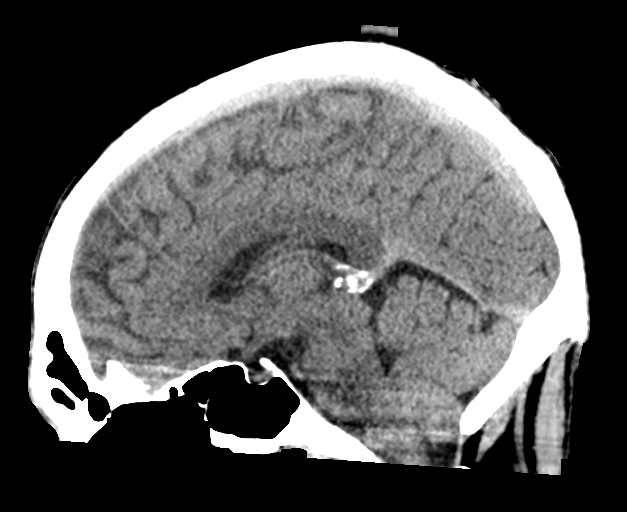
[im 40/60  brain]
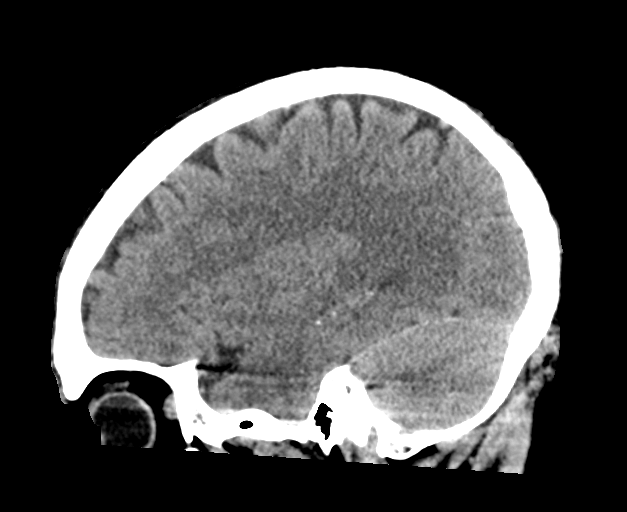

[16 of 47 positions shown; findings below may reference images not displayed]

FINDINGS: Brain: No evidence of parenchymal hemorrhage or extra-axial fluid
collection. No mass lesion, mass effect, or midline shift. No CT
evidence of acute infarction. Cerebral volume is age appropriate. No
ventriculomegaly.

Vascular: No hyperdense vessel or unexpected calcification.

Skull: No evidence of calvarial fracture.

Sinuses/Orbits: The visualized paranasal sinuses are essentially
clear.

Other:  The mastoid air cells are unopacified.
IMPRESSION: Negative head CT. No evidence of acute intracranial abnormality. No
evidence of calvarial fracture.
